# Patient Record
Sex: Female | Born: 1935 | Race: White | Hispanic: No | Smoking: Never smoker
Health system: Southern US, Community
[De-identification: ages and names within clinical notes are randomized; demographics above are authoritative.]

## PROBLEM LIST (undated history)

## (undated) DIAGNOSIS — G20A1 Parkinson's disease without dyskinesia, without mention of fluctuations: Secondary | ICD-10-CM

## (undated) DIAGNOSIS — I10 Essential (primary) hypertension: Secondary | ICD-10-CM

## (undated) DIAGNOSIS — E119 Type 2 diabetes mellitus without complications: Secondary | ICD-10-CM

## (undated) DIAGNOSIS — E785 Hyperlipidemia, unspecified: Secondary | ICD-10-CM

## (undated) DIAGNOSIS — G20 Parkinson's disease: Secondary | ICD-10-CM

## (undated) HISTORY — DX: Hyperlipidemia, unspecified: E78.5

## (undated) HISTORY — DX: Essential (primary) hypertension: I10

---

## 2007-01-23 HISTORY — PX: MINIMALLY INVASIVE RADIOACTIVE PARATHYROIDECTOMY: SHX5272

## 2014-08-10 ENCOUNTER — Ambulatory Visit: Payer: Self-pay | Admitting: Endocrinology

## 2014-09-02 ENCOUNTER — Other Ambulatory Visit: Payer: Self-pay | Admitting: *Deleted

## 2014-09-02 ENCOUNTER — Ambulatory Visit (INDEPENDENT_AMBULATORY_CARE_PROVIDER_SITE_OTHER): Payer: Medicare Other | Admitting: Endocrinology

## 2014-09-02 ENCOUNTER — Encounter: Payer: Self-pay | Admitting: Endocrinology

## 2014-09-02 VITALS — BP 122/82 | HR 83 | Temp 98.1°F | Resp 16 | Ht 65.0 in | Wt 155.0 lb

## 2014-09-02 DIAGNOSIS — E559 Vitamin D deficiency, unspecified: Secondary | ICD-10-CM | POA: Diagnosis not present

## 2014-09-02 DIAGNOSIS — G20 Parkinson's disease: Secondary | ICD-10-CM

## 2014-09-02 DIAGNOSIS — E210 Primary hyperparathyroidism: Secondary | ICD-10-CM | POA: Diagnosis not present

## 2014-09-02 DIAGNOSIS — I10 Essential (primary) hypertension: Secondary | ICD-10-CM | POA: Insufficient documentation

## 2014-09-02 NOTE — Progress Notes (Signed)
Patient ID: Ashley Mcintyre, female   DOB: 02/12/1929, 81 y.o.   MRN: [id number]           Chief complaint: High calcium  History of Present Illness:  Referring physician:   Review of records show that she has had a high calcium in 2009 when she was acutely ill and her calcium level had gone up to 15.7. She had a right inferior parathyroid adenoma removed at that time after being identified on the parathyroid scan  In 2014 her calcium level was marginally high at 10.2and subsequently it has been periodically mildly increased with a range of 9.8-10.8 In 05/2014 it was 10.6 but in June it was in the normal range  No results found for: CALCIUM  The hypercalcemia is not associated with any pathologic fractures, renal insufficiency, nephrolithiasis, sarcoidosis, known carcinoma, hyperthyroidism.  She thinks she had bone density a few years ago but not recently and was told it was normal She does not think she has had unusual back pain or height loss  Prior lab results:  PTH level in 06/2014 was 90  No results found for: PTH, CALCIUM, CAION, PHOS    25 (OH) Vitamin D level 39 in 06/2014      Medication List       This list is accurate as of: 09/02/14  5:04 PM.  Always use your most recent med list.               amLODipine 10 MG tablet  Commonly known as:  NORVASC  Take 10 mg by mouth daily.     aspirin EC 81 MG tablet  Take 81 mg by mouth.     citalopram 20 MG tablet  Commonly known as:  CELEXA  TAKE ONE TABLET BY MOUTH ONCE DAILY     clonazePAM 1 MG tablet  Commonly known as:  KLONOPIN  1- 1 1/2 po bid prn anxiety and/or restless leg syndrome     COLACE 100 MG capsule  Generic drug:  docusate sodium  Take 100 mg by mouth 2 (two) times daily.     gabapentin 300 MG capsule  Commonly known as:  NEURONTIN  TAKE TWO CAPSULES BY MOUTH TWICE DAILY     hydrochlorothiazide 25 MG tablet  Commonly known as:  HYDRODIURIL  Take 25 mg by mouth.     lisinopril 10 MG tablet    Commonly known as:  PRINIVIL,ZESTRIL  Take 10 mg by mouth.     pravastatin 80 MG tablet  Commonly known as:  PRAVACHOL  TAKE ONE TABLET BY MOUTH ONCE DAILY     predniSONE 10 MG tablet  Commonly known as:  DELTASONE  Take 10 mg by mouth daily with breakfast.     SYNTHROID 75 MCG tablet  Generic drug:  levothyroxine  Take by mouth.     VITAMIN D3 SUPER STRENGTH 2000 UNITS Tabs  Generic drug:  Cholecalciferol  Take by mouth.     ZOFRAN ODT 4 MG disintegrating tablet  Generic drug:  ondansetron  Take 4 mg by mouth.        Allergies: No Known Allergies  Past Medical History  Diagnosis Date  . Hyperlipidemia   . Hypertension     Past Surgical History  Procedure Laterality Date  . Minimally invasive radioactive parathyroidectomy  2009    [facility]    Family History  Problem Relation Age of Onset  . Hypothyroidism Daughter     Social History:  reports that she has never smoked.  She does not have any smokeless tobacco history on file. She reports that she does not drink alcohol. Her drug history is not on file.  Review of Systems  Constitutional: Negative for weight loss.       Weight stable over the last year, previously had been obese  Musculoskeletal: Positive for joint pain.  Neurological: Positive for tremors.       Has known Parkinson's disease.  Psychiatric/Behavioral: Positive for depression. The patient is nervous/anxious.     She has had hypertension for several years and has been on HCTZ 25 mg for several years also She has had occasional nausea and she takes Zofran as needed  Recently has had a rash on her extremities and has been treated with prednisone.  She has been told to have Sjogren's syndrome and is followed by rheumatologist  She has had hypothyroidism and has been on thyroid supplements for 20-30 years.  Her last TSH level was normal in 03/2014 and has been fairly consistent before that  She has known mild hyperlipidemia  EXAM:  BP 122/82  mmHg  Pulse 83  Temp(Src) 98.1 F (36.7 C)  Resp 16  Ht 5\' 5"  (1.651 m)  Wt 155 lb (70.308 kg)  BMI 25.79 kg/m2  SpO2 95%  Repeat blood pressure 150/80  GENERAL: Averagely built and nourished  No pallor, clubbing, lymphadenopathy or edema.   Skin: She has macular rash, erythematous on her lower legs and some on her arms  EYES:  Externally normal.   ENT: Oral mucosa and tongue normal.  THYROID:  Not palpable.  HEART:  Normal  S1 and S2; no murmur or click.  CHEST:  Normal shape Lungs:   Vescicular breath sounds heard equally.  No crepitations/ wheeze.  ABDOMEN:  No distention.  Liver and spleen not palpable.  No other mass or tenderness.  NEUROLOGICAL: .Reflexes are 1+ bilaterally at biceps.  She has mild tremor of her head and hands  SPINE AND JOINTS:  Normal.  No significant kyphosis, no spine tenderness  Assessment/Plan:   HYPERCALCEMIA:  She appears to have had recurrent hyperparathyroidism  Discussed the nature of primary hyperparathyroidism as well as normal role of the parathyroid glands.  Discussed potential  effects of hyperparathyroidism long-term on bone health, kidney stones and kidney function  Patient appears to have had a recurrence of hyperparathyroidism and may have another adenoma; her parathyroid hormone level is clearly increased However her calcium level has been only minimally increased Also may have some tendency to higher levels of calcium secondary to HCTZ  Explained to patient that surgery is indicated only there are symptoms of high calcium, a level over 1 point above the normal range or known osteoporosis.  Given her age and minimal increase in calcium she is not a candidate for surgery   However would like to assess her bone density to rule out osteoporosis that may need to be treated separately Will recheck her calcium today She will reduce her HCTZ to 12.5 mg for now and consider stopping it. She will need to follow-up with her PCP  for blood pressure management   Ashley Mcintyre 09/02/2014, 5:04 PM

## 2014-11-30 ENCOUNTER — Telehealth: Payer: Self-pay | Admitting: Endocrinology

## 2014-11-30 NOTE — Telephone Encounter (Signed)
Patient daughter ask if patient need to get a bone test before she see Dr Lucianne MussKumar, please advise

## 2014-12-02 ENCOUNTER — Telehealth: Payer: Self-pay | Admitting: Endocrinology

## 2014-12-02 NOTE — Telephone Encounter (Signed)
Patients daughter called a bone density test to be scheduled    Please advise    Thank you

## 2014-12-02 NOTE — Telephone Encounter (Signed)
Bone density is scheduled this Monday at 11 am and the [place] office.   Called patients daughter, vm is full, unable to leave a message.

## 2014-12-03 ENCOUNTER — Ambulatory Visit: Payer: Self-pay | Admitting: Endocrinology

## 2014-12-06 ENCOUNTER — Inpatient Hospital Stay: Admission: RE | Admit: 2014-12-06 | Payer: Medicare Other | Source: Ambulatory Visit

## 2015-04-13 ENCOUNTER — Emergency Department (HOSPITAL_BASED_OUTPATIENT_CLINIC_OR_DEPARTMENT_OTHER): Payer: Medicare Other

## 2015-04-13 ENCOUNTER — Encounter (HOSPITAL_BASED_OUTPATIENT_CLINIC_OR_DEPARTMENT_OTHER): Payer: Self-pay | Admitting: Emergency Medicine

## 2015-04-13 ENCOUNTER — Emergency Department (HOSPITAL_BASED_OUTPATIENT_CLINIC_OR_DEPARTMENT_OTHER)
Admission: EM | Admit: 2015-04-13 | Discharge: 2015-04-14 | Disposition: A | Discharge status: Home / Self Care | Payer: Medicare Other | Attending: Emergency Medicine | Admitting: Emergency Medicine

## 2015-04-13 DIAGNOSIS — R531 Weakness: Secondary | ICD-10-CM | POA: Diagnosis present

## 2015-04-13 DIAGNOSIS — Z8701 Personal history of pneumonia (recurrent): Secondary | ICD-10-CM | POA: Diagnosis not present

## 2015-04-13 DIAGNOSIS — Z79899 Other long term (current) drug therapy: Secondary | ICD-10-CM | POA: Diagnosis not present

## 2015-04-13 DIAGNOSIS — R197 Diarrhea, unspecified: Secondary | ICD-10-CM | POA: Diagnosis not present

## 2015-04-13 DIAGNOSIS — Z7982 Long term (current) use of aspirin: Secondary | ICD-10-CM | POA: Diagnosis not present

## 2015-04-13 DIAGNOSIS — R600 Localized edema: Secondary | ICD-10-CM | POA: Diagnosis not present

## 2015-04-13 DIAGNOSIS — R0989 Other specified symptoms and signs involving the circulatory and respiratory systems: Secondary | ICD-10-CM | POA: Diagnosis not present

## 2015-04-13 DIAGNOSIS — E860 Dehydration: Secondary | ICD-10-CM

## 2015-04-13 DIAGNOSIS — G20 Parkinson's disease: Secondary | ICD-10-CM | POA: Diagnosis not present

## 2015-04-13 DIAGNOSIS — E119 Type 2 diabetes mellitus without complications: Secondary | ICD-10-CM | POA: Insufficient documentation

## 2015-04-13 DIAGNOSIS — E785 Hyperlipidemia, unspecified: Secondary | ICD-10-CM | POA: Diagnosis not present

## 2015-04-13 DIAGNOSIS — Z7952 Long term (current) use of systemic steroids: Secondary | ICD-10-CM | POA: Diagnosis not present

## 2015-04-13 DIAGNOSIS — R05 Cough: Secondary | ICD-10-CM | POA: Diagnosis not present

## 2015-04-13 DIAGNOSIS — H578 Other specified disorders of eye and adnexa: Secondary | ICD-10-CM | POA: Insufficient documentation

## 2015-04-13 DIAGNOSIS — Z792 Long term (current) use of antibiotics: Secondary | ICD-10-CM | POA: Diagnosis not present

## 2015-04-13 DIAGNOSIS — I10 Essential (primary) hypertension: Secondary | ICD-10-CM | POA: Diagnosis not present

## 2015-04-13 HISTORY — DX: Parkinson's disease: G20

## 2015-04-13 HISTORY — DX: Parkinson's disease without dyskinesia, without mention of fluctuations: G20.A1

## 2015-04-13 HISTORY — DX: Type 2 diabetes mellitus without complications: E11.9

## 2015-04-13 LAB — CBC WITH DIFFERENTIAL/PLATELET
BASOS PCT: 1 %
Basophils Absolute: 0.1 10*3/uL (ref 0.0–0.1)
EOS ABS: 0.2 10*3/uL (ref 0.0–0.7)
EOS PCT: 3 %
HCT: 32.2 % — ABNORMAL LOW (ref 36.0–46.0)
HEMOGLOBIN: 10.7 g/dL — AB (ref 12.0–15.0)
Lymphocytes Relative: 17 %
Lymphs Abs: 1.3 10*3/uL (ref 0.7–4.0)
MCH: 29 pg (ref 26.0–34.0)
MCHC: 33.2 g/dL (ref 30.0–36.0)
MCV: 87.3 fL (ref 78.0–100.0)
MONOS PCT: 14 %
Monocytes Absolute: 1.1 10*3/uL — ABNORMAL HIGH (ref 0.1–1.0)
NEUTROS PCT: 65 %
Neutro Abs: 5 10*3/uL (ref 1.7–7.7)
PLATELETS: 222 10*3/uL (ref 150–400)
RBC: 3.69 MIL/uL — ABNORMAL LOW (ref 3.87–5.11)
RDW: 14 % (ref 11.5–15.5)
WBC: 7.6 10*3/uL (ref 4.0–10.5)

## 2015-04-13 LAB — COMPREHENSIVE METABOLIC PANEL
ALBUMIN: 3.4 g/dL — AB (ref 3.5–5.0)
ALK PHOS: 45 U/L (ref 38–126)
ALT: 10 U/L — ABNORMAL LOW (ref 14–54)
AST: 19 U/L (ref 15–41)
Anion gap: 10 (ref 5–15)
BUN: 34 mg/dL — ABNORMAL HIGH (ref 6–20)
CALCIUM: 9.5 mg/dL (ref 8.9–10.3)
CO2: 24 mmol/L (ref 22–32)
CREATININE: 1.42 mg/dL — AB (ref 0.44–1.00)
Chloride: 102 mmol/L (ref 101–111)
GFR, EST AFRICAN AMERICAN: 38 mL/min — AB (ref >60)
GFR, EST NON AFRICAN AMERICAN: 33 mL/min — AB (ref >60)
Glucose, Bld: 93 mg/dL (ref 65–99)
Potassium: 3.8 mmol/L (ref 3.5–5.1)
SODIUM: 136 mmol/L (ref 135–145)
TOTAL PROTEIN: 6.9 g/dL (ref 6.5–8.1)
Total Bilirubin: 0.4 mg/dL (ref 0.3–1.2)

## 2015-04-13 LAB — URINALYSIS, ROUTINE W REFLEX MICROSCOPIC
BILIRUBIN URINE: NEGATIVE
Glucose, UA: NEGATIVE mg/dL
Hgb urine dipstick: NEGATIVE
KETONES UR: NEGATIVE mg/dL
Leukocytes, UA: NEGATIVE
NITRITE: NEGATIVE
PH: 6 (ref 5.0–8.0)
PROTEIN: 30 mg/dL — AB
Specific Gravity, Urine: 1.011 (ref 1.005–1.030)

## 2015-04-13 LAB — URINE MICROSCOPIC-ADD ON
RBC / HPF: NONE SEEN RBC/hpf (ref 0–5)
WBC, UA: NONE SEEN WBC/hpf (ref 0–5)

## 2015-04-13 LAB — TROPONIN I

## 2015-04-13 LAB — I-STAT CG4 LACTIC ACID, ED
LACTIC ACID, VENOUS: 1.02 mmol/L (ref 0.5–2.0)
Lactic Acid, Venous: 0.79 mmol/L (ref 0.5–2.0)

## 2015-04-13 MED ORDER — SODIUM CHLORIDE 0.9 % IV BOLUS (SEPSIS)
500.0000 mL | Freq: Once | INTRAVENOUS | Status: AC
  Administered 2015-04-13: 500 mL via INTRAVENOUS

## 2015-04-13 NOTE — Discharge Instructions (Signed)
Dehydration, Adult Dehydration means your body does not have as much fluid or water as it needs. It happens when you take in less fluid than you lose. Your kidneys, brain, and heart will not work properly without the right amount of fluids.  Dehydration can range from mild to severe. It should be treated right away to help prevent it from becoming severe. HOME CARE  Drink enough fluid to keep your pee (urine) clear or pale yellow.  Drink water or fluid slowly by taking small sips. You can also try sucking on ice cubes.  Have food or drinks that contain electrolytes. Examples include bananas and sports drinks.  Take over-the-counter and prescription medicines only as told by your doctor.  Prepare oral rehydration solution (ORS) according to the instructions that came with it. Take sips of ORS every 5 minutes until your pee returns to normal.  If you are throwing up (vomiting) or have watery poop (diarrhea), keep trying to drink water, ORS, or both.  If you have watery poop, avoid:  Drinks with caffeine.  Fruit juice.  Milk.  Carbonated soft drinks.  Do not take salt tablets. This can lead to having too much sodium in your body (hypernatremia). GET HELP IF:  You cannot eat or drink without throwing up.  You have had mild watery poop for longer than 24 hours.  You have a fever. GET HELP RIGHT AWAY IF:   You have very strong thirst.  You have very bad watery poop.  You have not peed in 6-8 hours, or you have peed only a small amount of very dark pee.  You have shriveled skin.  You are dizzy, confused, or both.   This information is not intended to replace advice given to you by your health care provider. Make sure you discuss any questions you have with your health care provider.   Document Released: 11/04/2008 Document Revised: 09/29/2014 Document Reviewed: 05/26/2014 Elsevier Interactive Patient Education 2016 [employer]  Diarrhea Diarrhea is frequent loose and  watery bowel movements. It can cause you to feel weak and dehydrated. Dehydration can cause you to become tired and thirsty, have a dry mouth, and have decreased urination that often is dark yellow. Diarrhea is a sign of another problem, most often an infection that will not last long. In most cases, diarrhea typically lasts 2-3 days. However, it can last longer if it is a sign of something more serious. It is important to treat your diarrhea as directed by your caregiver to lessen or prevent future episodes of diarrhea. CAUSES  Some common causes include:  Gastrointestinal infections caused by viruses, bacteria, or parasites.  Food poisoning or food allergies.  Certain medicines, such as antibiotics, chemotherapy, and laxatives.  Artificial sweeteners and fructose.  Digestive disorders. HOME CARE INSTRUCTIONS  Ensure adequate fluid intake (hydration): Have 1 cup (8 oz) of fluid for each diarrhea episode. Avoid fluids that contain simple sugars or sports drinks, fruit juices, whole milk products, and sodas. Your urine should be clear or pale yellow if you are drinking enough fluids. Hydrate with an oral rehydration solution that you can purchase at pharmacies, retail stores, and online. You can prepare an oral rehydration solution at home by mixing the following ingredients together:   - tsp table salt.   tsp baking soda.   tsp salt substitute containing potassium chloride.  1  tablespoons sugar.  1 L (34 oz) of water.  Certain foods and beverages may increase the speed at which food moves  through the gastrointestinal (GI) tract. These foods and beverages should be avoided and include:  Caffeinated and alcoholic beverages.  High-fiber foods, such as raw fruits and vegetables, nuts, seeds, and whole grain breads and cereals.  Foods and beverages sweetened with sugar alcohols, such as xylitol, sorbitol, and mannitol.  Some foods may be well tolerated and may help thicken stool  including:  Starchy foods, such as rice, toast, pasta, low-sugar cereal, oatmeal, grits, baked potatoes, crackers, and bagels.  Bananas.  Applesauce.  Add probiotic-rich foods to help increase healthy bacteria in the GI tract, such as yogurt and fermented milk products.  Wash your hands well after each diarrhea episode.  Only take over-the-counter or prescription medicines as directed by your caregiver.  Take a warm bath to relieve any burning or pain from frequent diarrhea episodes. SEEK IMMEDIATE MEDICAL CARE IF:   You are unable to keep fluids down.  You have persistent vomiting.  You have blood in your stool, or your stools are black and tarry.  You do not urinate in 6-8 hours, or there is only a small amount of very dark urine.  You have abdominal pain that increases or localizes.  You have weakness, dizziness, confusion, or light-headedness.  You have a severe headache.  Your diarrhea gets worse or does not get better.  You have a fever or persistent symptoms for more than 2-3 days.  You have a fever and your symptoms suddenly get worse. MAKE SURE YOU:   Understand these instructions.  Will watch your condition.  Will get help right away if you are not doing well or get worse.   This information is not intended to replace advice given to you by your health care provider. Make sure you discuss any questions you have with your health care provider.   Document Released: 12/29/2001 Document Revised: 01/29/2014 Document Reviewed: 09/16/2011 Elsevier Interactive Patient Education 2016 Elsevier Inc.  Weakness Weakness is a lack of strength. It may be felt all over the body (generalized) or in one specific part of the body (focal). Some causes of weakness can be serious. You may need further medical evaluation, especially if you are elderly or you have a history of immunosuppression (such as chemotherapy or HIV), kidney disease, heart disease, or diabetes. CAUSES    Weakness can be caused by many different things, including:  Infection.  Physical exhaustion.  Internal bleeding or other blood loss that results in a lack of red blood cells (anemia).  Dehydration. This cause is more common in elderly people.  Side effects or electrolyte abnormalities from medicines, such as pain medicines or sedatives.  Emotional distress, anxiety, or depression.  Circulation problems, especially severe peripheral arterial disease.  Heart disease, such as rapid atrial fibrillation, bradycardia, or heart failure.  Nervous system disorders, such as Guillain-Barr syndrome, multiple sclerosis, or stroke. DIAGNOSIS  To find the cause of your weakness, your caregiver will take your history and perform a physical exam. Lab tests or X-rays may also be ordered, if needed. TREATMENT  Treatment of weakness depends on the cause of your symptoms and can vary greatly. HOME CARE INSTRUCTIONS   Rest as needed.  Eat a well-balanced diet.  Try to get some exercise every day.  Only take over-the-counter or prescription medicines as directed by your caregiver. SEEK MEDICAL CARE IF:   Your weakness seems to be getting worse or spreads to other parts of your body.  You develop new aches or pains. SEEK IMMEDIATE MEDICAL CARE IF:  You cannot perform your normal daily activities, such as getting dressed and feeding yourself.  You cannot walk up and down stairs, or you feel exhausted when you do so.  You have shortness of breath or chest pain.  You have difficulty moving parts of your body.  You have weakness in only one area of the body or on only one side of the body.  You have a fever.  You have trouble speaking or swallowing.  You cannot control your bladder or bowel movements.  You have black or bloody vomit or stools. MAKE SURE YOU:  Understand these instructions.  Will watch your condition.  Will get help right away if you are not doing well or get  worse.   This information is not intended to replace advice given to you by your health care provider. Make sure you discuss any questions you have with your health care provider.   Document Released: 01/08/2005 Document Revised: 07/10/2011 Document Reviewed: 03/09/2011 Elsevier Interactive Patient Education [employer]

## 2015-04-13 NOTE — ED Provider Notes (Signed)
CSN: [id number]     Arrival date & time 04/13/15  1730 History  By signing my name below, I, Ashley Mcintyre, attest that this documentation has been prepared under the direction and in the presence of Loren Raceravid Jerome Viglione, MD. Electronically Signed: Ronney LionSuzanne Mcintyre, ED Scribe. 04/13/2015. 6:11 PM.   Chief Complaint  Patient presents with  . Weakness  . Diarrhea   The history is provided by the patient. No language interpreter was used.   HPI Comments: Ashley Mcintyre is a 82 y.o. female with a history of hyperlipidemia, hypertension, and Parkinson disease, who presents to the Emergency Department complaining of gradual-onset, constant, gradually worsening, generalized weakness that began 3 weeks ago. Patient states she was diagnosed with pneumonia at an UC center and given antibiotics but has not noticed much improvement. Other associated symptoms include cough, diarrhea, dark-colored stool, back pain, light-headedness, dizziness exacerbated by standing, and bilateral leg swelling. She also notes frequent falling due to her weakness and dizziness. She states she has struck her head "a lot" and has various bruises on her body due to her falls. Patient states she was given a course of antibiotics after being seen at an UC center, which she is scheduled to finish in 6 days. She denies neck pain.   Past Medical History  Diagnosis Date  . Hyperlipidemia   . Hypertension   . Parkinson disease (HCC)   . Diabetes mellitus without complication [facility](HCC)    Past Surgical History  Procedure Laterality Date  . Minimally invasive radioactive parathyroidectomy  2009    [facility]   Family History  Problem Relation Age of Onset  . Hypothyroidism Daughter    Social History  Substance Use Topics  . Smoking status: Never Smoker   . Smokeless tobacco: None  . Alcohol Use: No   OB History    No data available     Review of Systems  Constitutional: Positive for fatigue. Negative for fever and chills.  Respiratory: Positive  for cough. Negative for shortness of breath.   Cardiovascular: Positive for leg swelling. Negative for chest pain.  Gastrointestinal: Positive for diarrhea. Negative for nausea, vomiting, abdominal pain and constipation.       Positive for dark-colored stool  Genitourinary: Negative for dysuria, frequency and flank pain.  Musculoskeletal: Positive for back pain. Negative for neck pain.  Skin: Positive for color change (bruising due to frequent falls) and wound. Negative for rash.  Neurological: Positive for dizziness, weakness (generalized) and light-headedness. Negative for syncope, numbness and headaches.  All other systems reviewed and are negative.     Allergies  Review of patient's allergies indicates no known allergies.  Home Medications   Prior to Admission medications   Medication Sig Start Date End Date Taking? Authorizing Provider  amLODipine (NORVASC) 10 MG tablet Take 10 mg by mouth daily.   Yes Historical Provider, MD  amoxicillin-clavulanate (AUGMENTIN) 875-125 MG tablet Take 1 tablet by mouth 2 (two) times daily.   Yes Historical Provider, MD  aspirin EC 81 MG tablet Take 81 mg by mouth. 10/04/09  Yes Historical Provider, MD  citalopram (CELEXA) 20 MG tablet TAKE ONE TABLET BY MOUTH ONCE DAILY 08/24/14  Yes Historical Provider, MD  clonazePAM (KLONOPIN) 1 MG tablet 1- 1 1/2 po bid prn anxiety and/or restless leg syndrome 04/13/14  Yes Historical Provider, MD  docusate sodium (COLACE) 100 MG capsule Take 100 mg by mouth 2 (two) times daily.   Yes Historical Provider, MD  gabapentin (NEURONTIN) 300 MG capsule TAKE TWO CAPSULES  BY MOUTH TWICE DAILY 06/10/14  Yes Historical Provider, MD  hydrochlorothiazide (HYDRODIURIL) 25 MG tablet Take 25 mg by mouth. 09/15/13  Yes Historical Provider, MD  levothyroxine (SYNTHROID) 75 MCG tablet Take by mouth. 04/15/14  Yes Historical Provider, MD  lisinopril (PRINIVIL,ZESTRIL) 10 MG tablet Take 10 mg by mouth. 09/15/13  Yes Historical Provider, MD   meclizine (ANTIVERT) 12.5 MG tablet Take 12.5 mg by mouth 3 (three) times daily as needed for dizziness.   Yes Historical Provider, MD  pravastatin (PRAVACHOL) 80 MG tablet TAKE ONE TABLET BY MOUTH ONCE DAILY 08/26/14  Yes Historical Provider, MD  predniSONE (DELTASONE) 10 MG tablet Take 5 mg by mouth daily with breakfast.    Yes Historical Provider, MD  Cholecalciferol (VITAMIN D3 SUPER STRENGTH) 2000 UNITS TABS Take by mouth. 07/18/10   Historical Provider, MD   BP 118/90 mmHg  Pulse 47  Temp(Src) 97.9 F (36.6 C) (Oral)  Resp 18  Ht  (1.651 m)  Wt 140 lb (63.504 kg)  BMI 23.30 kg/m2  SpO2 89% Physical Exam  Constitutional: She is oriented to person, place, and time. She appears well-developed and well-nourished. No distress.  HENT:  Head: Normocephalic and atraumatic.  Mouth/Throat: Oropharynx is clear and moist.  Small amount of redness just inferior to the right eye. No underlying bony tenderness or deformity.  Eyes: Conjunctivae and EOM are normal. Pupils are equal, round, and reactive to light.  Neck: Normal range of motion. Neck supple. No tracheal deviation present.  No posterior midline cervical tenderness to palpation. No meningismus.  Cardiovascular: Normal rate and regular rhythm.  Exam reveals no gallop and no friction rub.   No murmur heard. Pulmonary/Chest: Effort normal. No respiratory distress. She has no wheezes. She has rales. She exhibits no tenderness.  Decreased breath sounds in bilateral bases with a few scattered rhonchi  Abdominal: Soft. Bowel sounds are normal. She exhibits no distension and no mass. There is no tenderness. There is no rebound and no guarding.  Scattered contusions to the right upper abdomen.  Musculoskeletal: Normal range of motion. She exhibits edema. She exhibits no tenderness.  1+ bilateral lower extremity edema. No calf swelling, tenderness or asymmetry. Distal pulses are equal and intact.  Neurological: She is alert and oriented to  person, place, and time.  Patient is alert and oriented x3 with clear, goal oriented speech. Patient has 5/5 motor in all extremities. Sensation is intact to light touch. Bilateral finger-to-nose is normal with no signs of dysmetria.   Skin: Skin is warm and dry. No rash noted. No erythema.  Psychiatric: She has a normal mood and affect. Her behavior is normal.  Nursing note and vitals reviewed.   ED Course  Procedures (including critical care time)  DIAGNOSTIC STUDIES: Oxygen Saturation is 100% on RA, normal by my interpretation.    COORDINATION OF CARE: 6:11 PM - Discussed treatment plan with pt and family at bedside which includes diagnostic testing, including CT scan. Pt and family verbalized understanding and agreed to plan.   Labs Review Labs Reviewed  CBC WITH DIFFERENTIAL/PLATELET - Abnormal; Notable for the following:    RBC 3.69 (*)    Hemoglobin 10.7 (*)    HCT 32.2 (*)    Monocytes Absolute 1.1 (*)    All other components within normal limits  COMPREHENSIVE METABOLIC PANEL - Abnormal; Notable for the following:    BUN 34 (*)    Creatinine, Ser 1.42 (*)    Albumin 3.4 (*)    ALT  10 (*)    GFR calc non Af Amer 33 (*)    GFR calc Af Amer 38 (*)    All other components within normal limits  URINALYSIS, ROUTINE W REFLEX MICROSCOPIC (NOT AT [facility]) - Abnormal; Notable for the following:    Protein, ur 30 (*)    All other components within normal limits  URINE MICROSCOPIC-ADD ON - Abnormal; Notable for the following:    Squamous Epithelial / LPF 0-5 (*)    Bacteria, UA RARE (*)    All other components within normal limits  GASTROINTESTINAL PANEL BY PCR, STOOL (REPLACES STOOL CULTURE)  TROPONIN I  I-STAT CG4 LACTIC ACID, ED  I-STAT CG4 LACTIC ACID, ED    Imaging Review Dg Chest 2 View  04/13/2015  CLINICAL DATA:  Pneumonia EXAM: CHEST  2 VIEW COMPARISON:  09/09/2007 FINDINGS: Moderate cardiomegaly. Low volumes. Lungs are clear. No pneumothorax or pleural effusion.  IMPRESSION: Cardiomegaly without decompensation. Electronically Signed   By: Jolaine Click M.D.   On: 04/13/2015 19:59   Ct Head Wo Contrast  04/13/2015  CLINICAL DATA:  Parkinson's disease.  Hypertension EXAM: CT HEAD WITHOUT CONTRAST TECHNIQUE: Contiguous axial images were obtained from the base of the skull through the vertex without intravenous contrast. COMPARISON:  September 02, 2007 FINDINGS: There is moderate diffuse atrophy. There is no intracranial mass, hemorrhage, extra-axial fluid collection, or midline shift. There is patchy small vessel disease in the centra semiovale bilaterally. Mild small vessel disease is also noted in the left thalamus. Elsewhere gray-white compartments are normal. No acute infarct evident. Bony calvarium appears intact. The mastoid air cells on the left are clear. There is opacification of multiple inferior mastoid air cells on the right. No intraorbital lesions are apparent in the visualized orbital regions. IMPRESSION: Atrophy with patchy periventricular and left thalamic small vessel disease. No intracranial mass, hemorrhage, or acute appearing infarct evident. Inferior mastoid air cell disease on the right. Electronically Signed   By: Bretta Bang III M.D.   On: 04/13/2015 20:03   I have personally reviewed and evaluated these images and lab results as part of my medical decision-making.   EKG Interpretation None      MDM   Final diagnoses:  Generalized weakness  Dehydration  Diarrhea, unspecified type    I personally performed the services described in this documentation, which was scribed in my presence. The recorded information has been reviewed and is accurate.   Patient very well-appearing. Blood pressure remained stable. Oxygen saturation is mid 90s on room air. She is feeling much better after IV fluids. She is in no respiratory distress. We'll have follow-up with her primary physician. Return precautions given.  Loren Racer, MD 04/13/15  [phone]

## 2015-04-13 NOTE — ED Notes (Addendum)
Patient was dx with PNA / flu   2 weeks. The patient reports that she is not getting any better and that she feels like she is dehydration. The patient also reports that she had had diarrhea for the last 2 days.  Patient currently denies any chest pain but family reports that she has had some chest tightness recently

## 2015-04-13 NOTE — ED Notes (Signed)
Call to Gustavus MessingJean Brown daughter informing her that Mrs Beverely PaceCheek is being discharged home.

## 2015-04-13 NOTE — ED Notes (Signed)
Delma Posteggy Villatoro family members  daughter she lives with is Gustavus MessingJean Brown cell # [phone] and Home [phone]  Daughter she doesn't live with is Lavera GuiseDarlene Perfetti cell #  [phone]

## 2018-08-23 ENCOUNTER — Emergency Department (HOSPITAL_COMMUNITY): Payer: Medicare Other

## 2018-08-23 ENCOUNTER — Other Ambulatory Visit: Payer: Self-pay

## 2018-08-23 ENCOUNTER — Emergency Department (HOSPITAL_COMMUNITY)
Admission: EM | Admit: 2018-08-23 | Discharge: 2018-08-23 | Disposition: A | Discharge status: Home / Self Care | Payer: Medicare Other | Attending: Emergency Medicine | Admitting: Emergency Medicine

## 2018-08-23 ENCOUNTER — Encounter (HOSPITAL_COMMUNITY): Payer: Self-pay

## 2018-08-23 DIAGNOSIS — G20 Parkinson's disease: Secondary | ICD-10-CM | POA: Diagnosis not present

## 2018-08-23 DIAGNOSIS — S92505A Nondisplaced unspecified fracture of left lesser toe(s), initial encounter for closed fracture: Secondary | ICD-10-CM

## 2018-08-23 DIAGNOSIS — E119 Type 2 diabetes mellitus without complications: Secondary | ICD-10-CM | POA: Insufficient documentation

## 2018-08-23 DIAGNOSIS — Z79899 Other long term (current) drug therapy: Secondary | ICD-10-CM | POA: Insufficient documentation

## 2018-08-23 DIAGNOSIS — F0390 Unspecified dementia without behavioral disturbance: Secondary | ICD-10-CM | POA: Diagnosis not present

## 2018-08-23 DIAGNOSIS — S92515A Nondisplaced fracture of proximal phalanx of left lesser toe(s), initial encounter for closed fracture: Secondary | ICD-10-CM | POA: Insufficient documentation

## 2018-08-23 DIAGNOSIS — R531 Weakness: Secondary | ICD-10-CM | POA: Diagnosis not present

## 2018-08-23 DIAGNOSIS — I10 Essential (primary) hypertension: Secondary | ICD-10-CM | POA: Diagnosis not present

## 2018-08-23 DIAGNOSIS — Y999 Unspecified external cause status: Secondary | ICD-10-CM | POA: Insufficient documentation

## 2018-08-23 DIAGNOSIS — Y939 Activity, unspecified: Secondary | ICD-10-CM | POA: Diagnosis not present

## 2018-08-23 DIAGNOSIS — Y929 Unspecified place or not applicable: Secondary | ICD-10-CM | POA: Diagnosis not present

## 2018-08-23 DIAGNOSIS — R627 Adult failure to thrive: Secondary | ICD-10-CM | POA: Diagnosis present

## 2018-08-23 DIAGNOSIS — W19XXXA Unspecified fall, initial encounter: Secondary | ICD-10-CM | POA: Diagnosis not present

## 2018-08-23 DIAGNOSIS — E785 Hyperlipidemia, unspecified: Secondary | ICD-10-CM | POA: Insufficient documentation

## 2018-08-23 LAB — CBC WITH DIFFERENTIAL/PLATELET
Abs Immature Granulocytes: 0.01 10*3/uL (ref 0.00–0.07)
Basophils Absolute: 0 10*3/uL (ref 0.0–0.1)
Basophils Relative: 0 %
Eosinophils Absolute: 0.1 10*3/uL (ref 0.0–0.5)
Eosinophils Relative: 1 %
HCT: 37.7 % (ref 36.0–46.0)
Hemoglobin: 12.2 g/dL (ref 12.0–15.0)
Immature Granulocytes: 0 %
Lymphocytes Relative: 19 %
Lymphs Abs: 1.1 10*3/uL (ref 0.7–4.0)
MCH: 29.1 pg (ref 26.0–34.0)
MCHC: 32.4 g/dL (ref 30.0–36.0)
MCV: 90 fL (ref 80.0–100.0)
Monocytes Absolute: 0.8 10*3/uL (ref 0.1–1.0)
Monocytes Relative: 14 %
Neutro Abs: 3.7 10*3/uL (ref 1.7–7.7)
Neutrophils Relative %: 66 %
Platelets: 203 10*3/uL (ref 150–400)
RBC: 4.19 MIL/uL (ref 3.87–5.11)
RDW: 12.9 % (ref 11.5–15.5)
WBC: 5.7 10*3/uL (ref 4.0–10.5)
nRBC: 0 % (ref 0.0–0.2)

## 2018-08-23 LAB — COMPREHENSIVE METABOLIC PANEL
ALT: 14 U/L (ref 0–44)
AST: 23 U/L (ref 15–41)
Albumin: 3.9 g/dL (ref 3.5–5.0)
Alkaline Phosphatase: 48 U/L (ref 38–126)
Anion gap: 11 (ref 5–15)
BUN: 24 mg/dL — ABNORMAL HIGH (ref 8–23)
CO2: 27 mmol/L (ref 22–32)
Calcium: 10.3 mg/dL (ref 8.9–10.3)
Chloride: 100 mmol/L (ref 98–111)
Creatinine, Ser: 0.98 mg/dL (ref 0.44–1.00)
GFR calc Af Amer: >60 mL/min (ref >60)
GFR calc non Af Amer: 52 mL/min — ABNORMAL LOW (ref >60)
Glucose, Bld: 112 mg/dL — ABNORMAL HIGH (ref 70–99)
Potassium: 3.6 mmol/L (ref 3.5–5.1)
Sodium: 138 mmol/L (ref 135–145)
Total Bilirubin: 0.6 mg/dL (ref 0.3–1.2)
Total Protein: 7 g/dL (ref 6.5–8.1)

## 2018-08-23 NOTE — Discharge Instructions (Signed)
Recommend following up with your primary doctor to discuss her ongoing medical problems and discuss long-term care plan.  If she has additional falls, particularly if she hits her head or passes out, develops fever, chest pain, difficulty breathing or any concerns and, recommend return to ER for reassessment.

## 2018-08-23 NOTE — ED Triage Notes (Signed)
EMS reports from home, family called for evaluation, advancing dementia and Parkinson's. Failure to thrive, family states unable to continue to care for her and would like to have Pt placed in facility.  BP 124/72 HR 68 RR 18 Sp02 98 RA CBG 207 Temp 97.7

## 2018-08-23 NOTE — ED Notes (Signed)
Family refused post op shoe as patient is non ambulatory.

## 2018-08-23 NOTE — Progress Notes (Signed)
CSW received a call from pt's family that pt's daughter desires resources for future placement for SNF/Memory Care combinations and would like to peruse from home in order to choose a pre-selected Memory Care with skilled  Care, but is choosing to D/C home to consider options.  CSW provided Medicare.gov lists of nursing homes and discussed spending down and placement options.  Pt's daughter was appreciative and thanked the CSW.  EDP/RN updated.  Please reconsult if future social work needs arise.  CSW signing off, as social work intervention is no longer needed.  Alphonse Guild. Maryori Weide, LCSW, LCAS, CSI Transitions of Care Clinical Social Worker Care Coordination Department Ph: [phone]

## 2018-08-23 NOTE — ED Provider Notes (Signed)
[facility]-EMERGENCY DEPT Provider Note   CSN: [id number] Arrival date & time: 08/23/18  1505     History   Chief Complaint Chief Complaint  Patient presents with  . Failure To Thrive    HPI Ashley Mcintyre is a 85 y.o. female.  Past medical history diabetes, Parkinson's disease, dementia, hyperlipidemia, hypertension presents emerged department with concern for declining functional status.  History primarily obtained from patient's daughter who is at bedside.  States patient lives with her and she is primary caregiver.  States that patient has had general decline over the past couple months and has been having frequent falls.  Most recently, fell today, was unsteady with gait, went down slowly and did not suffer any significant trauma, did not hit her head or back.  Approximately 1 week ago patient had tripped over her left foot, also at that time daughter denies any head trauma or loss of consciousness.  They noted some bruising on her left second toe.  Patient at time of interview denies any acute complaints, states she feels at her baseline.  Specifically she denies any chest pain, difficulty breathing, cough, fever, abdominal pain, nausea or vomiting.  Daughter states that she has had poor appetite lately, but still has been eating and drinking daily.  Daughter reports that she has been in discussions with her primary doctor about placement in long-term care facility.  Have not completed initial application for this though.     HPI  Past Medical History:  Diagnosis Date  . Diabetes mellitus without complication (HCC)   . Hyperlipidemia   . Hypertension   . Parkinson disease [facility](HCC)     Patient Active Problem List   Diagnosis Date Noted  . Hyperparathyroidism, primary (HCC) 09/02/2014  . Parkinson's disease (HCC) 09/02/2014  . Essential hypertension 09/02/2014    Past Surgical History:  Procedure Laterality Date  . MINIMALLY INVASIVE RADIOACTIVE  PARATHYROIDECTOMY  2009   [facility]     OB History   No obstetric history on file.      Home Medications    Prior to Admission medications   Medication Sig Start Date End Date Taking? Authorizing Provider  amLODipine (NORVASC) 10 MG tablet Take 10 mg by mouth daily.    [provider]  amoxicillin-clavulanate (AUGMENTIN) 875-125 MG tablet Take 1 tablet by mouth 2 (two) times daily.    [provider]  aspirin EC 81 MG tablet Take 81 mg by mouth. 10/04/09   [provider]  Cholecalciferol (VITAMIN D3 SUPER STRENGTH) 2000 UNITS TABS Take by mouth. 07/18/10   [provider]  citalopram (CELEXA) 20 MG tablet TAKE ONE TABLET BY MOUTH ONCE DAILY 08/24/14   [provider]  clonazePAM (KLONOPIN) 1 MG tablet 1- 1 1/2 po bid prn anxiety and/or restless leg syndrome 04/13/14   [provider]  docusate sodium (COLACE) 100 MG capsule Take 100 mg by mouth 2 (two) times daily.    [provider]  gabapentin (NEURONTIN) 300 MG capsule TAKE TWO CAPSULES BY MOUTH TWICE DAILY 06/10/14   [provider]  hydrochlorothiazide (HYDRODIURIL) 25 MG tablet Take 25 mg by mouth. 09/15/13   [provider]  levothyroxine (SYNTHROID) 75 MCG tablet Take by mouth. 04/15/14   [provider]  lisinopril (PRINIVIL,ZESTRIL) 10 MG tablet Take 10 mg by mouth. 09/15/13   [provider]  meclizine (ANTIVERT) 12.5 MG tablet Take 12.5 mg by mouth 3 (three) times daily as needed for dizziness.    [provider]  pravastatin (PRAVACHOL) 80 MG tablet TAKE ONE TABLET BY MOUTH ONCE DAILY 08/26/14   [provider]  predniSONE (DELTASONE) 10 MG tablet Take 5 mg by mouth daily with breakfast.     [provider]    Family History Family History  Problem Relation Age of Onset  . Hypothyroidism Daughter     Social History Social History   Tobacco Use  . Smoking status: Never Smoker  . Smokeless tobacco: Never  Used  Substance Use Topics  . Alcohol use: No    Alcohol/week: 0.0 standard drinks  . Drug use: Not on file     Allergies   Patient has no known allergies.   Review of Systems Review of Systems  Constitutional: Negative for chills and fever.  HENT: Negative for ear pain and sore throat.   Eyes: Negative for pain and visual disturbance.  Respiratory: Negative for cough and shortness of breath.   Cardiovascular: Negative for chest pain and palpitations.  Gastrointestinal: Negative for abdominal pain and vomiting.  Genitourinary: Negative for dysuria and hematuria.  Musculoskeletal: Negative for arthralgias and back pain.  Skin: Negative for color change and rash.  Neurological: Negative for seizures and syncope.  All other systems reviewed and are negative.    Physical Exam Updated Vital Signs BP 118/85   Pulse 76   Temp 99.1 F (37.3 C) (Oral)   Resp 11   SpO2 99%   Physical Exam   ED Treatments / Results  Labs (all labs ordered are listed, but only abnormal results are displayed) Labs Reviewed  COMPREHENSIVE METABOLIC PANEL - Abnormal; Notable for the following components:      Result Value   Glucose, Bld 112 (*)    BUN 24 (*)    GFR calc non Af Amer 52 (*)    All other components within normal limits  CBC WITH DIFFERENTIAL/PLATELET  URINALYSIS, ROUTINE W REFLEX MICROSCOPIC    EKG None  Radiology No results found.  Procedures Procedures (including critical care time)  Medications Ordered in ED Medications - No data to display   Initial Impression / Assessment and Plan / ED Course  I have reviewed the triage vital signs and the nursing notes.  Pertinent labs & imaging results that were available during my care of the patient were reviewed by me and considered in my medical decision making (see chart for details).        85 year old lady with Parkinson's, dementia, lives at home with daughter, came to the emergency department with concern for  patient's general decline, weakness.  On exam patient was well-appearing, no distress.  Patient denied any acute complaints.  Had reported a couple recent falls, no significant trauma was noted on my exam, falls were witnessed, no head trauma noted on exam for history per daughter.  Did no toe injury and had nondisplaced tuft fracture left second toe.  Recommended using fracture shoe however patient and patient's daughter declined fracture shoe.  Daughter was concerned about her ability to care for her mother long-term.  Social work discussed options and provided assistance in this.  At this time, daughter is able to care for patient and given above work-up, feel patient appropriate for further care as outpatient.  Recommended follow-up with primary doctor for recheck and assistance with nursing facility placement.    After the discussed management above, the patient was determined to be safe for discharge.  The patient was in agreement with this plan and all questions regarding their  care were answered.  ED return precautions were discussed and the patient will return to the ED with any significant worsening of condition.    Final Clinical Impressions(s) / ED Diagnoses   Final diagnoses:  Weakness  Parkinson disease ([place])  Closed nondisplaced fracture of phalanx of lesser toe of left foot, unspecified phalanx, initial encounter    ED Discharge Orders    None       Lucrezia Starch, MD 08/23/18 2209

## 2018-09-22 ENCOUNTER — Emergency Department (HOSPITAL_COMMUNITY): Payer: Medicare Other

## 2018-09-22 ENCOUNTER — Other Ambulatory Visit: Payer: Self-pay

## 2018-09-22 ENCOUNTER — Encounter (HOSPITAL_COMMUNITY): Payer: Self-pay | Admitting: Emergency Medicine

## 2018-09-22 ENCOUNTER — Emergency Department (HOSPITAL_COMMUNITY)
Admission: EM | Admit: 2018-09-22 | Discharge: 2018-09-22 | Disposition: A | Discharge status: Home / Self Care | Payer: Medicare Other | Attending: Emergency Medicine | Admitting: Emergency Medicine

## 2018-09-22 DIAGNOSIS — M25571 Pain in right ankle and joints of right foot: Secondary | ICD-10-CM | POA: Diagnosis not present

## 2018-09-22 DIAGNOSIS — Z5321 Procedure and treatment not carried out due to patient leaving prior to being seen by health care provider: Secondary | ICD-10-CM | POA: Diagnosis not present

## 2018-09-22 LAB — BASIC METABOLIC PANEL
Anion gap: 10 (ref 5–15)
BUN: 17 mg/dL (ref 8–23)
CO2: 26 mmol/L (ref 22–32)
Calcium: 9.9 mg/dL (ref 8.9–10.3)
Chloride: 96 mmol/L — ABNORMAL LOW (ref 98–111)
Creatinine, Ser: 1.03 mg/dL — ABNORMAL HIGH (ref 0.44–1.00)
GFR calc Af Amer: 57 mL/min — ABNORMAL LOW (ref >60)
GFR calc non Af Amer: 49 mL/min — ABNORMAL LOW (ref >60)
Glucose, Bld: 122 mg/dL — ABNORMAL HIGH (ref 70–99)
Potassium: 3.6 mmol/L (ref 3.5–5.1)
Sodium: 132 mmol/L — ABNORMAL LOW (ref 135–145)

## 2018-09-22 LAB — CBC WITH DIFFERENTIAL/PLATELET
Abs Immature Granulocytes: 0.02 10*3/uL (ref 0.00–0.07)
Basophils Absolute: 0 10*3/uL (ref 0.0–0.1)
Basophils Relative: 0 %
Eosinophils Absolute: 0.1 10*3/uL (ref 0.0–0.5)
Eosinophils Relative: 2 %
HCT: 34.7 % — ABNORMAL LOW (ref 36.0–46.0)
Hemoglobin: 11.4 g/dL — ABNORMAL LOW (ref 12.0–15.0)
Immature Granulocytes: 0 %
Lymphocytes Relative: 13 %
Lymphs Abs: 1 10*3/uL (ref 0.7–4.0)
MCH: 29.8 pg (ref 26.0–34.0)
MCHC: 32.9 g/dL (ref 30.0–36.0)
MCV: 90.6 fL (ref 80.0–100.0)
Monocytes Absolute: 0.9 10*3/uL (ref 0.1–1.0)
Monocytes Relative: 12 %
Neutro Abs: 5.6 10*3/uL (ref 1.7–7.7)
Neutrophils Relative %: 73 %
Platelets: 224 10*3/uL (ref 150–400)
RBC: 3.83 MIL/uL — ABNORMAL LOW (ref 3.87–5.11)
RDW: 12.4 % (ref 11.5–15.5)
WBC: 7.7 10*3/uL (ref 4.0–10.5)
nRBC: 0 % (ref 0.0–0.2)

## 2018-09-22 NOTE — ED Notes (Signed)
Pt's family member informed RN that she was taking pt home.  She has night medications to take and does not have them here.

## 2018-09-22 NOTE — ED Triage Notes (Signed)
Pt here for a mechanical fall. Had Xray at urgent care that showed multiple broken bones to her right ankle. She has swelling and deformity noted. Has copy of Xray imaging with her. Not on blood thinners, did not hit head.

## 2018-12-23 DEATH — deceased

## 2020-03-06 IMAGING — CR DG ANKLE COMPLETE 3+V*R*
3 series · 3 of 3 positions shown · non-contrast
Comparison: None.

CLINICAL DATA: Fall tonight.  Ankle pain.

EXAM:
RIGHT ANKLE - COMPLETE 3+ VIEW

[ankle ap]
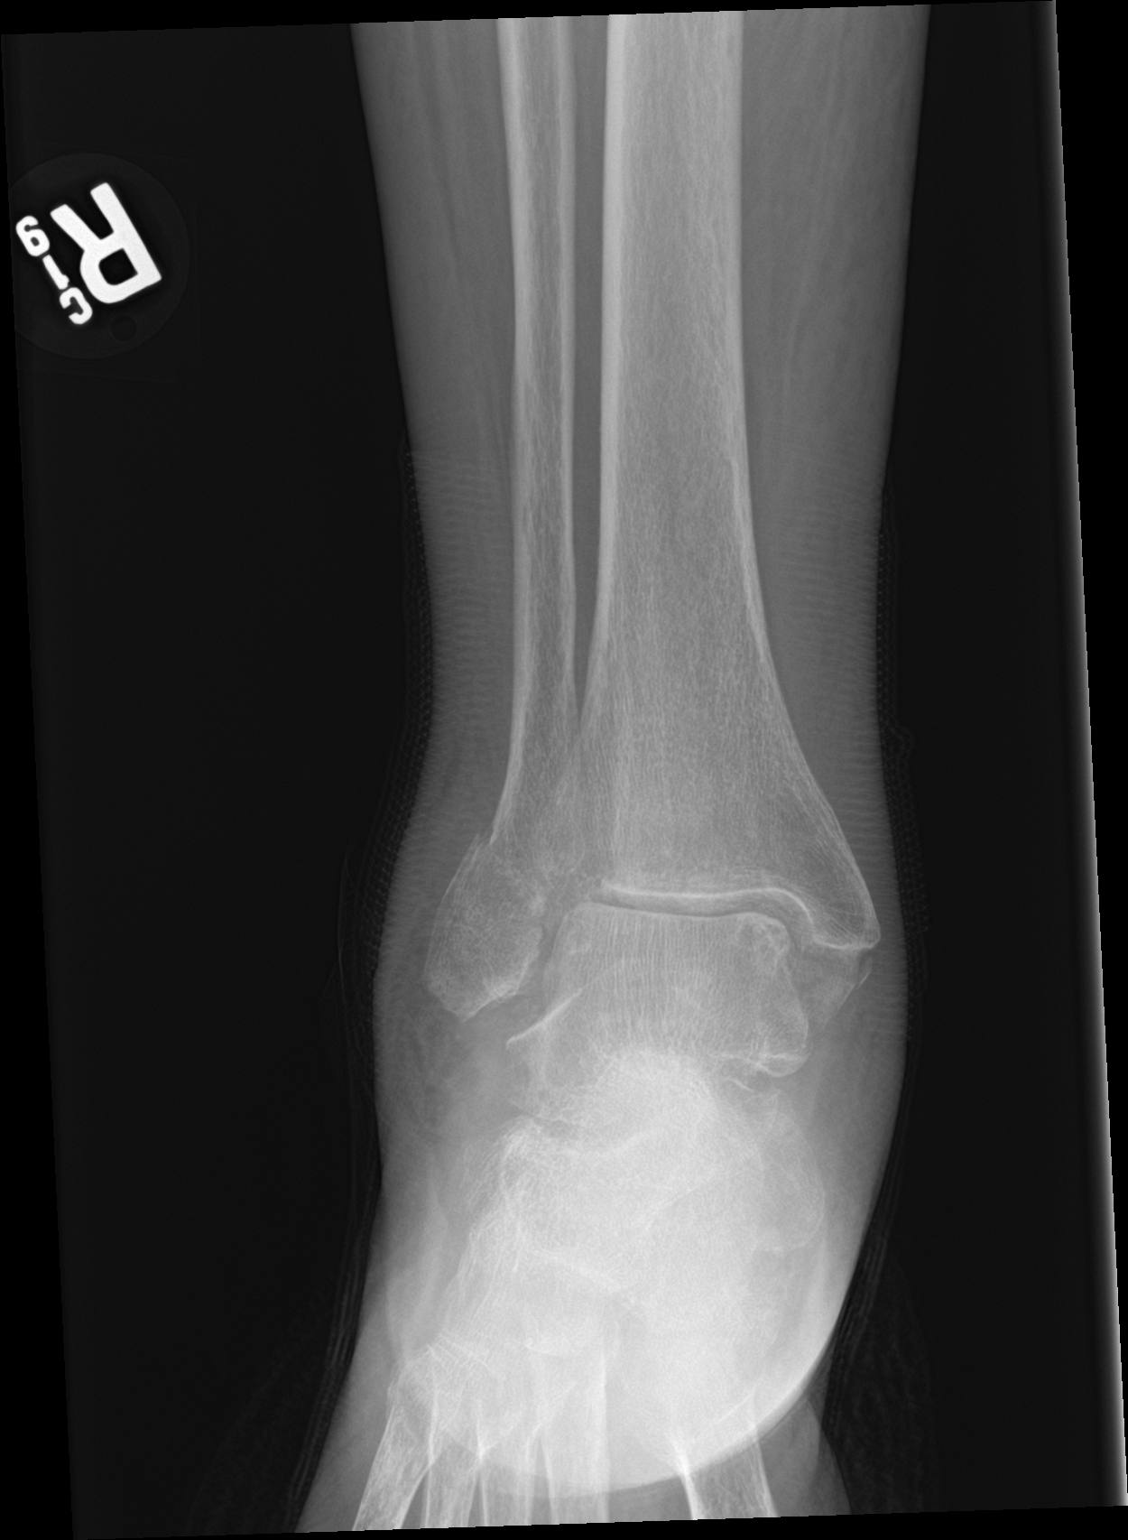

[ankle obl]
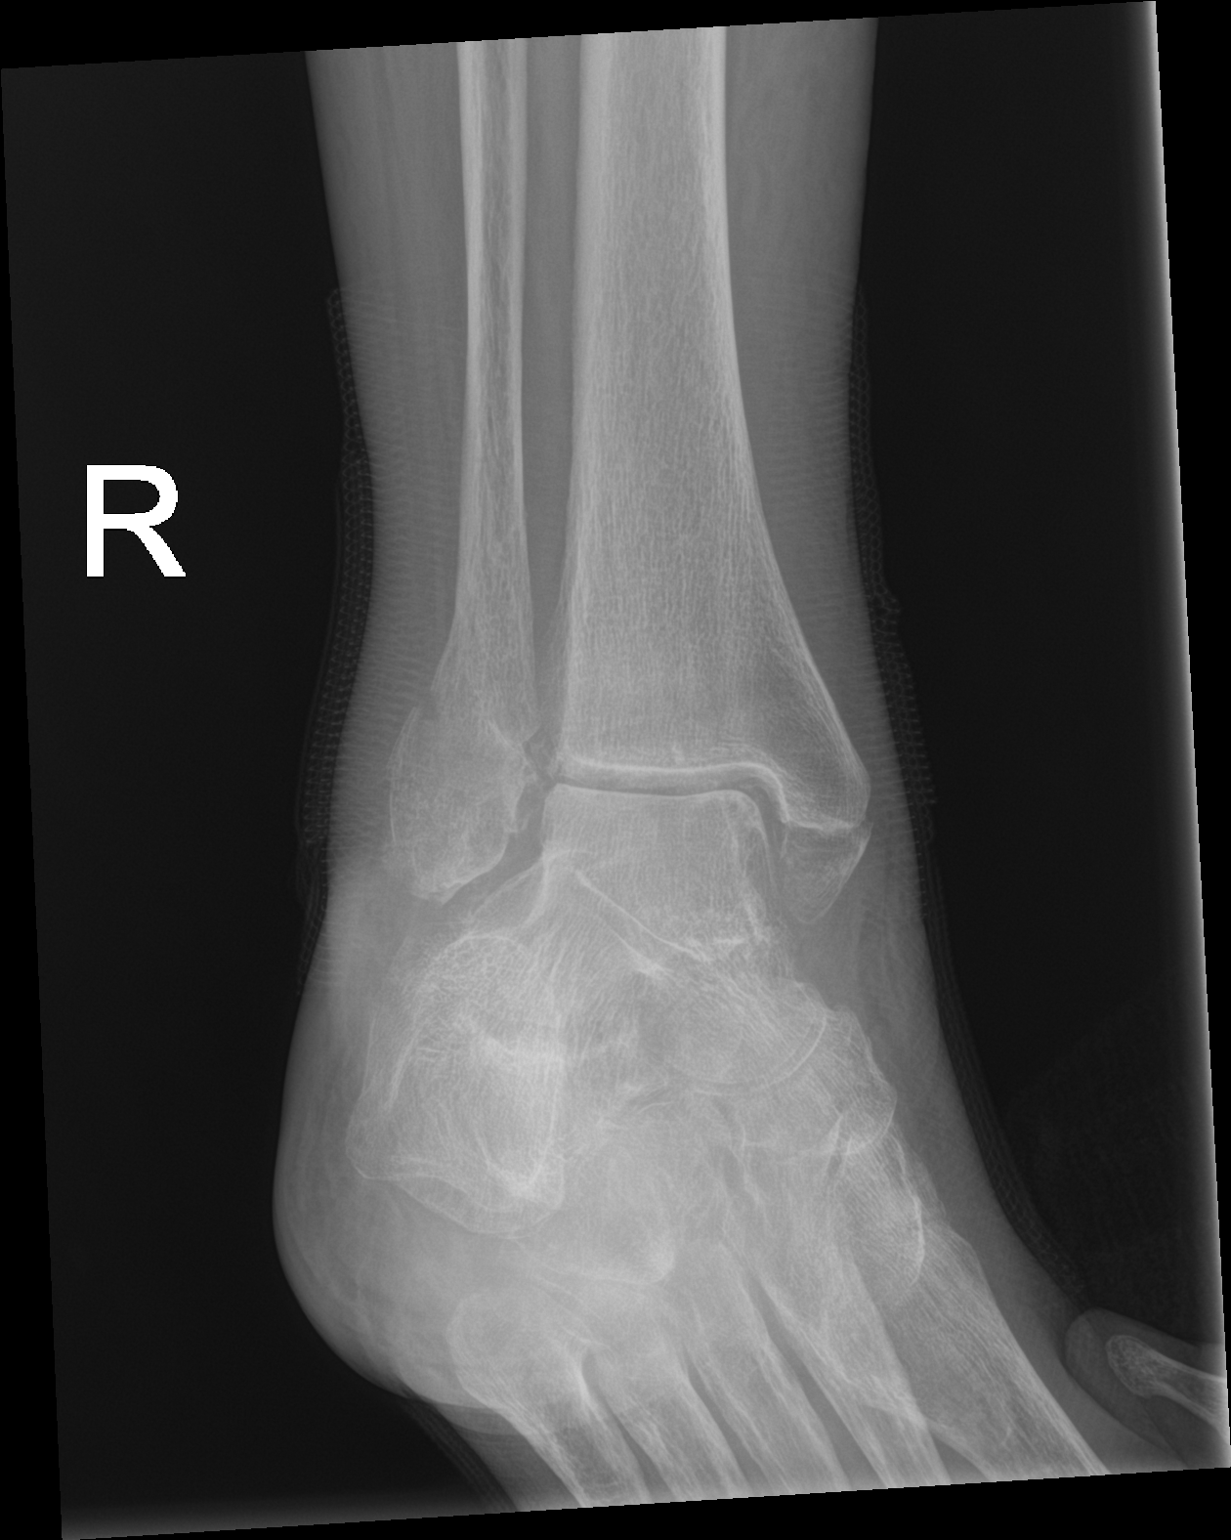

[ankle lat]
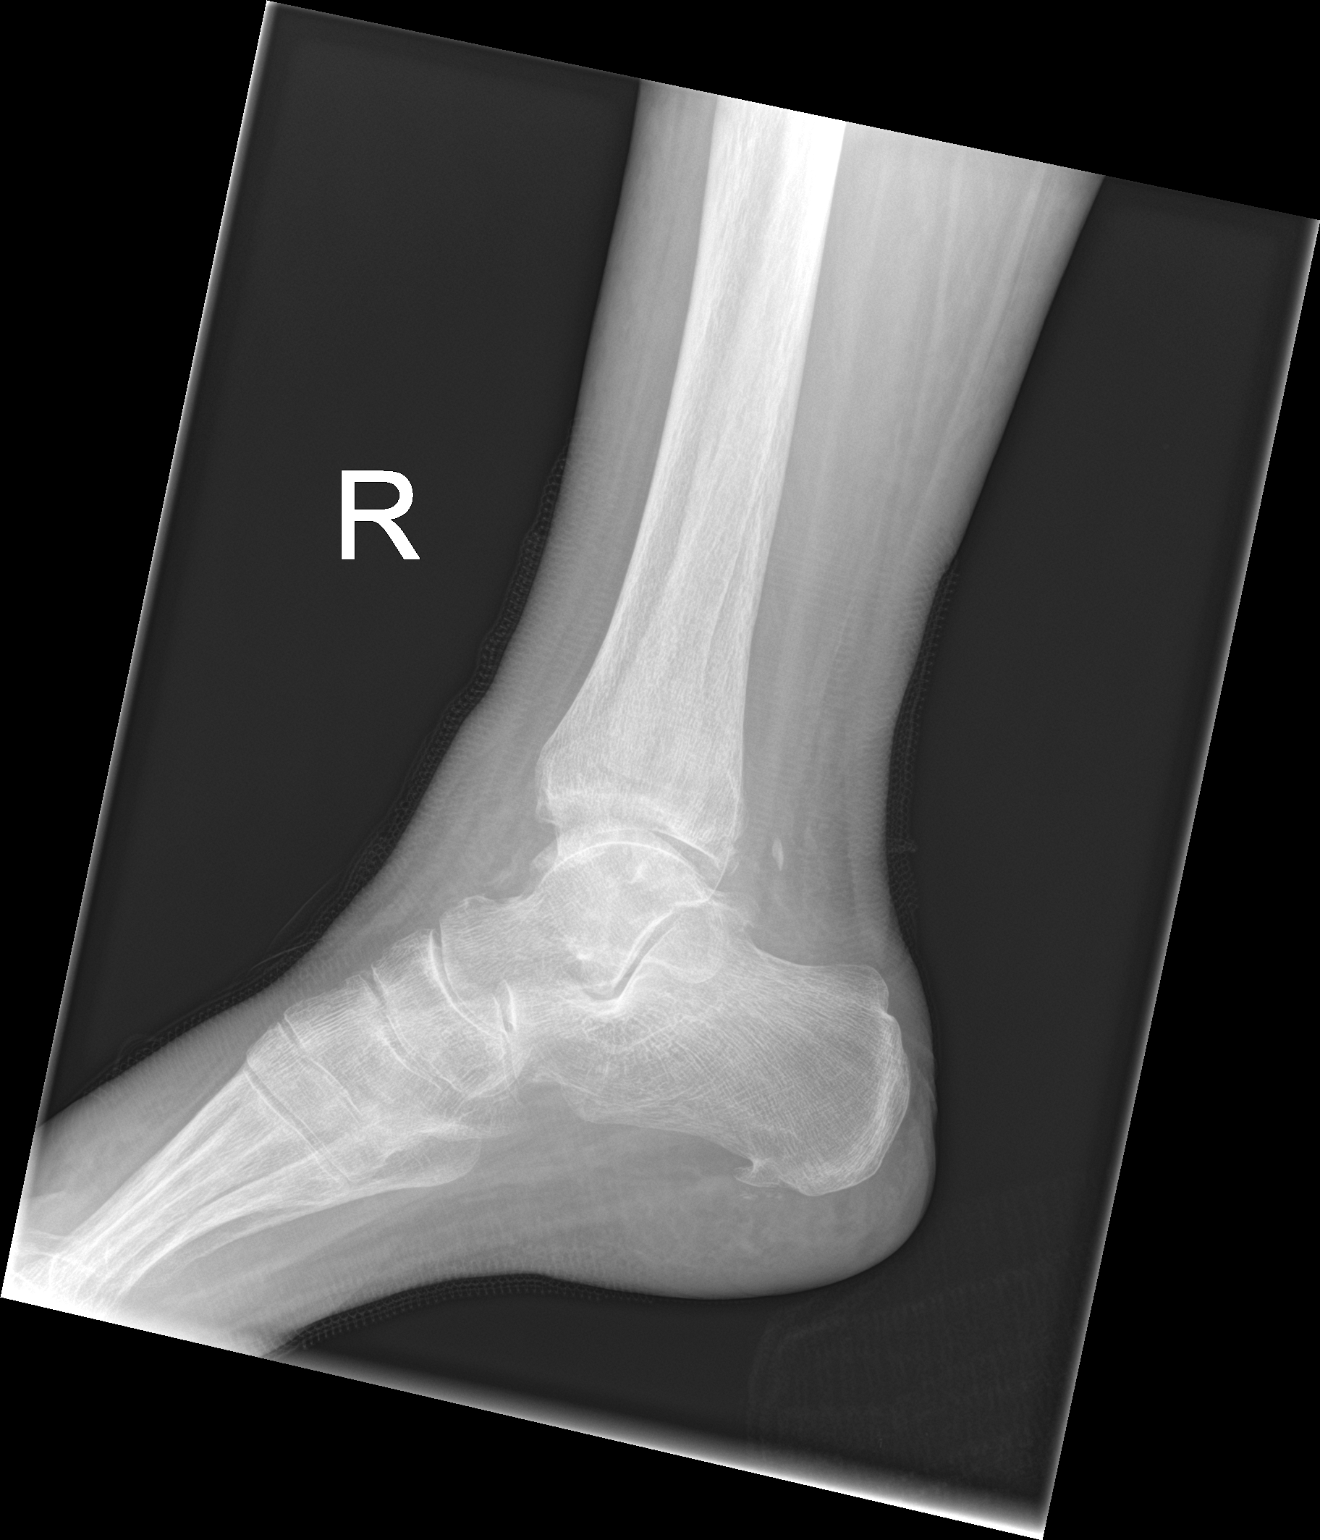

[3 of 3 positions shown; findings below may reference images not displayed]

FINDINGS: The bones are demineralized. There is an acute oblique fracture of
the distal fibula at the level of the ankle mortise which is mildly
displaced laterally. There is an acute transverse fracture through
the base of the medial malleolus. On the lateral view, there is a
possible nondisplaced fracture of the posterior malleolus. There is
mild widening of the ankle mortise without subluxation of the talus.
No tarsal bone fractures are identified. There is moderate soft
tissue swelling of the ankle.
IMPRESSION: Mildly displaced trimalleolar fracture of the right ankle as
described.
